# Patient Record
Sex: Female | Born: 1990 | Race: Black or African American | Hispanic: No | Marital: Single
Health system: Midwestern US, Community
[De-identification: ages and names within clinical notes are randomized; demographics above are authoritative.]

## PROBLEM LIST (undated history)

## (undated) DIAGNOSIS — Z789 Other specified health status: Secondary | ICD-10-CM

## (undated) DIAGNOSIS — Z808 Family history of malignant neoplasm of other organs or systems: Secondary | ICD-10-CM

## (undated) DIAGNOSIS — D649 Anemia, unspecified: Secondary | ICD-10-CM

---

## 2015-05-01 ENCOUNTER — Other Ambulatory Visit: Payer: Self-pay | Admitting: Obstetrics and Gynecology

## 2015-05-01 DIAGNOSIS — E049 Nontoxic goiter, unspecified: Secondary | ICD-10-CM

## 2015-05-03 ENCOUNTER — Ambulatory Visit
Admission: RE | Admit: 2015-05-03 | Discharge: 2015-05-03 | Disposition: A | Discharge status: Home / Self Care | Payer: BLUE CROSS/BLUE SHIELD | Source: Ambulatory Visit | Attending: Obstetrics and Gynecology | Admitting: Obstetrics and Gynecology

## 2015-05-03 DIAGNOSIS — E049 Nontoxic goiter, unspecified: Secondary | ICD-10-CM

## 2015-06-26 ENCOUNTER — Ambulatory Visit (INDEPENDENT_AMBULATORY_CARE_PROVIDER_SITE_OTHER): Payer: BLUE CROSS/BLUE SHIELD | Admitting: Internal Medicine

## 2015-06-26 ENCOUNTER — Encounter: Payer: Self-pay | Admitting: Internal Medicine

## 2015-06-26 VITALS — BP 102/78 | HR 111 | Temp 99.1°F | Ht 64.0 in | Wt 227.0 lb

## 2015-06-26 DIAGNOSIS — E042 Nontoxic multinodular goiter: Secondary | ICD-10-CM | POA: Diagnosis not present

## 2015-06-26 NOTE — Progress Notes (Addendum)
Patient ID: Linda Proctor, female   DOB: Mar 12, 1991, 24 y.o.   MRN: [id number]   HPI  Linda Proctor is a 24 y.o.-year-old female, referred by her  ObGyn Dr., Dr. 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 She is here with her mother who offers part of the hx.  No h/o neck compression sxs >> saw ObGyn 04/2015 >> palpated a goiter.  Thyroid U/S (05/03/2015):  Thyromegaly with multiple nodules, including 3 nodules >2 cm in the left thyroid lobe:   Pt denies feeling nodules in neck, hoarseness, dysphagia/odynophagia, SOB with lying down.  I reviewed pt's thyroid tests: 05/01/2015: TSH 2.118   Pt denies: - fatigue - heat intolerance/cold intolerance - tremors - palpitations - anxiety/depression - hyperdefecation/constipation - weight loss - weight gain - dry skin - hair loss  Pt does have a FH of thyroid ds. + FH of thyroid cancer - in mother (PTC). No h/o radiation tx to head or neck.  No seaweed or kelp, no recent contrast studies. No steroid use. No herbal supplements.   I reviewed her chart and she also has a history of vit D def and anemia.   ROS: Constitutional: no weight gain/loss, no fatigue, no subjective hyperthermia/hypothermia, + poor sleep Eyes: no blurry vision, no xerophthalmia ENT: no sore throat, no nodules palpated in throat, no dysphagia/odynophagia, no hoarseness Cardiovascular: no CP/SOB/palpitations/leg swelling Respiratory: no cough/SOB Gastrointestinal: no N/V/D/C Musculoskeletal: + muscle pain/no joint aches Skin: no rashes Neurological: no tremors/numbness/tingling/dizziness Psychiatric: no depression/anxiety  PMH: - see HPI  No past surgical history.  Social History   Social History  . Marital Status: Single    Spouse Name: N/A  . Number of Children: 0   Occupational History  . [employer]   Social History Main Topics  . Smoking status: Never Smoker   . Smokeless tobacco: Not on file  . Alcohol Use: No  .  Drug Use: No   No prescription meds/  No Known Allergies  FH: - DM and HTN in GM - PTC in mother  PE: BP 102/78 mmHg  Pulse 111  Temp(Src) 99.1 F (37.3 C)  Ht 5' 4" (1.626 m)  Wt 227 lb (102.967 kg)  BMI 38.95 kg/m2 Wt Readings from Last 3 Encounters:  06/26/15 227 lb (102.967 kg)   Constitutional: overweight, in NAD Eyes: PERRLA, EOMI, no exophthalmos ENT: moist mucous membranes, + thyromegaly - L>R, no cervical lymphadenopathy Cardiovascular: RRR (at the time of the exam), No MRG Respiratory: CTA B Gastrointestinal: abdomen soft, NT, ND, BS+ Musculoskeletal: no deformities, strength intact in all 4;  Skin: moist, warm, no rashes Neurological: no tremor with outstretched hands, DTR normal in all 4  ASSESSMENT: 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 Heterogeneous echotexture. Multiple Proctor nodules. Largest 12 x 9 x 12 mm hypoechoic solid, inferior pole. There is an adjacent 11 x 5 x 7 mm lower pole hypoechoic nodule. Remainder less than 1 cm.  Left thyroid lobe: 59 x 23 x 29 mm. Multiple nodules. Complex cystic nodule 23 x 9 x 20 mm, upper pole. There is adjacent deep 11 x 5 x 8 mm hypoechoic nodule. There is a complex mostly cystic 24 x 11 x 15 mm nodule, mid lobe. Adjacent 21 x 14 x 12 mm hypoechoic nodule.  Isthmus Thickness: 6 mm. No nodules visualized.  Lymphadenopathy: None visualized.  IMPRESSION: 1. Thyromegaly with bilateral nodules. The dominant left lesions meet consensus criteria for  biopsy.   PLAN: 1. MNG  - I reviewed the images of her thyroid ultrasound along with the patient and her mother. I pointed out that the dominant nodules are large, this being a risk factor for cancer. They are hypoechoic, without calcifications (they have colloid granules), without internal blood flow, more wide than tall, and well delimited from surrounding tissue. Pt does have a thyroid cancer family history but no  personal history of RxTx to head/neck.  - the only way that we can tell exactly if her nodules are cancerous or not is by doing a thyroid biopsy (FNA). I explained what the test entails.  - another option is to wait for another 6 months to a year and only intervene at that time >> would not recommend this  - I explained that this is not cancer, we can continue to follow her on a yearly basis, and check another ultrasound in another year or 2. - we discussed about ThyCa in general: prognosis, evolution, treatment and f/u. - patient decided to have the FNA done now >> I ordered this.  - I'll see her back in a year, assuming her FNA is normal. If FNA abnormal, we will meet sooner.  - I advised pt to join my chart and I will send her the results through there   FNA pending - appt on 07/19/2015. I will addend the results when they become available.  Adequacy Reason Satisfactory For Evaluation. Diagnosis THYROID, FINE NEEDLE ASPIRATION, LMP ( SPECIMEN 1 OF 3, COLLECTED ON 07/19/15): BENIGN. (BETHESDA CATEGORY II). FINDINGS CONSISTENT WITH LYMPHOCYTIC (HASHIMOTO) THYROIDITIS. Linda Laws MD Pathologist, Electronic Signature (Case signed 07/21/2015) Specimen Clinical Information Family h/o thyroid ca-pt's mother, There is a complex mostly cystic 24 x 11 x 15 mm nodule, left mid lobe Source  Thyroid, Fine Needle Aspiration, Lt Lobe LMP, (Specimen 1 of 3, collected on 07/19/15 )  Adequacy Reason Satisfactory For Evaluation. Diagnosis THYROID, FINE NEEDLE ASPIRATION, LEFT LOBE, LUP (SPECIMEN 2 OF 3 BENIGN. (BETHESDA CATEGORY II). FINDINGS CONSISTENT WITH LYMPHOCYTIC (HASHIMOTO) THYROIDITIS. Linda Laws MD Pathologist, Electronic Signature (Case signed 07/21/2015) Specimen Clinical Information Family h/o thyroid CA-PT'S mother, Complex cystic nodule 23 x 9 x 20 mm, left upper pole Source Thyroid, Fine Needle Aspiration, Lt Lobe LUP, (Specimen 2 of 3, collected on 07/19/15 )  Adequacy  Reason Satisfactory For Evaluation. Diagnosis THYROID, FINE NEEDLE ASPIRATION, LEFT LOBE LMP (SPECIMEN 3 OF 3 COLLECTED 07-19-2015) BENIGN. (BETHESDA CATEGORY II). FINDINGS CONSISTENT WITH LYMPHOCYTIC (HASHIMOTO) THYROIDITIS. Linda Laws MD Pathologist, Electronic Signature (Case signed 07/21/2015) Specimen Clinical Information Family h/o thyroid CA-PT'S mother, Adjacent 21 x 14 x 12 mm hypoechoic nodule left mid pole Source Thyroid, Fine Needle Aspiration, Lt Lobe LMP, (Specimen 3 of 3, collected on 07/19/15 )  All nodules are benign, and suggest inflammatory changes due to Hashimoto's thyroiditis. I will repeat her thyroid tests when the patient returns. Nothing to do for now.

## 2015-06-26 NOTE — Patient Instructions (Signed)
Please schedule the thyroid biopsies with Annasha.  Please return in 1 year or sooner if needed.  Thyroid Biopsy The thyroid gland is a butterfly-shaped gland situated in the front of the neck. It produces hormones which affect metabolism, growth and development, and body temperature. A thyroid biopsy is a procedure in which small samples of tissue or fluid are removed from the thyroid gland or mass and examined under a microscope. This test is done to determine the cause of thyroid problems, such as infection, cancer, or other thyroid problems. There are 2 ways to obtain samples: 1. Fine needle biopsy. Samples are removed using a thin needle inserted through the skin and into the thyroid gland or mass. 2. Open biopsy. Samples are removed after a cut (incision) is made through the skin. LET YOUR CAREGIVER KNOW ABOUT:   Allergies.  Medications taken including herbs, eye drops, over-the-counter medications, and creams.  Use of steroids (by mouth or creams).  Previous problems with anesthetics or numbing medicine.  Possibility of pregnancy, if this applies.  History of blood clots (thrombophlebitis).  History of bleeding or blood problems.  Previous surgery.  Other health problems. RISKS AND COMPLICATIONS  Bleeding from the site. The risk of bleeding is higher if you have a bleeding disorder or are taking any blood thinning medications (anticoagulants).  Infection.  Injury to structures near the thyroid gland. BEFORE THE PROCEDURE  This is a procedure that can be done as an outpatient. Confirm the time that you need to arrive for your procedure. Confirm whether there is a need to fast or withhold any medications. A blood sample may be done to determine your blood clotting time. Medicine may be given to help you relax (sedative). PROCEDURE Fine needle biopsy. You will be awake during the procedure. You may be asked to lie on your back with your head tipped backward to extend your  neck. Let your caregiver know if you cannot tolerate the positioning. An area on your neck will be cleansed. A needle is inserted through the skin of your neck. You may feel a mild discomfort during this procedure. You may be asked to avoid coughing, talking, swallowing, or making sounds during some portions of the procedure. The needle is withdrawn once tissue or fluid samples have been removed. Pressure may be applied to the neck to reduce swelling and ensure that bleeding has stopped. The samples will be sent for examination.  Open biopsy. You will be given general anesthesia. You will be asleep during the procedure. An incision is made in your neck. A sample of thyroid tissue or the mass is removed. The tissue sample or mass will be sent for examination. The sample or mass may be examined during the biopsy. If the sample or mass contains cancer cells, some or all of the thyroid gland may be removed. The incision is closed with stitches. AFTER THE PROCEDURE  Your recovery will be assessed and monitored. If there are no problems, as an outpatient, you should be able to go home shortly after the procedure. If you had a fine needle biopsy:  You may have soreness at the biopsy site for 1 to 2 days. If you had an open biopsy:   You may have soreness at the biopsy site for 3 to 4 days.  You may have a hoarse voice or sore throat for 1 to 2 days. Obtaining the Test Results It is your responsibility to obtain your test results. Do not assume everything is normal if you  have not heard from your caregiver or the medical facility. It is important for you to follow up on all of your test results. HOME CARE INSTRUCTIONS   Keeping your head raised on a pillow when you are lying down may ease biopsy site discomfort.  Supporting the back of your head and neck with both hands as you sit up from a lying position may ease biopsy site discomfort.  Only take over-the-counter or prescription medicines for pain,  discomfort, or fever as directed by your caregiver.  Throat lozenges or gargling with warm salt water may help to soothe a sore throat. SEEK IMMEDIATE MEDICAL CARE IF:   You have severe bleeding from the biopsy site.  You have difficulty swallowing.  You have a fever.  You have increased pain, swelling, redness, or warmth at the biopsy site.  You notice pus coming from the biopsy site.  You have swollen glands (lymph nodes) in your neck. Document Released: 08/18/2007 Document Revised: 02/15/2013 Document Reviewed: 01/13/2014 [facility] Patient Information 2015 [place], [place]. This information is not intended to replace advice given to you by your health care provider. Make sure you discuss any questions you have with your health care provider.

## 2015-07-07 ENCOUNTER — Encounter: Payer: Self-pay | Admitting: Internal Medicine

## 2015-07-19 ENCOUNTER — Other Ambulatory Visit (HOSPITAL_COMMUNITY)
Admission: RE | Admit: 2015-07-19 | Discharge: 2015-07-19 | Disposition: A | Discharge status: Home / Self Care | Payer: BLUE CROSS/BLUE SHIELD | Source: Ambulatory Visit | Attending: Radiology | Admitting: Radiology

## 2015-07-19 ENCOUNTER — Other Ambulatory Visit (HOSPITAL_COMMUNITY)
Admission: RE | Admit: 2015-07-19 | Discharge: 2015-07-19 | Disposition: A | Discharge status: Home / Self Care | Payer: BLUE CROSS/BLUE SHIELD | Source: Ambulatory Visit | Attending: Internal Medicine | Admitting: Internal Medicine

## 2015-07-19 ENCOUNTER — Ambulatory Visit
Admission: RE | Admit: 2015-07-19 | Discharge: 2015-07-19 | Disposition: A | Discharge status: Home / Self Care | Payer: BLUE CROSS/BLUE SHIELD | Source: Ambulatory Visit | Attending: Internal Medicine | Admitting: Internal Medicine

## 2015-07-19 DIAGNOSIS — E041 Nontoxic single thyroid nodule: Secondary | ICD-10-CM | POA: Insufficient documentation

## 2015-12-06 ENCOUNTER — Emergency Department (HOSPITAL_COMMUNITY)
Admission: EM | Admit: 2015-12-06 | Discharge: 2015-12-06 | Disposition: A | Discharge status: Home / Self Care | Payer: Self-pay | Attending: Emergency Medicine | Admitting: Emergency Medicine

## 2015-12-06 ENCOUNTER — Encounter (HOSPITAL_COMMUNITY): Payer: Self-pay | Admitting: Emergency Medicine

## 2015-12-06 DIAGNOSIS — Z3202 Encounter for pregnancy test, result negative: Secondary | ICD-10-CM | POA: Insufficient documentation

## 2015-12-06 DIAGNOSIS — R55 Syncope and collapse: Secondary | ICD-10-CM | POA: Insufficient documentation

## 2015-12-06 DIAGNOSIS — E869 Volume depletion, unspecified: Secondary | ICD-10-CM | POA: Insufficient documentation

## 2015-12-06 DIAGNOSIS — R42 Dizziness and giddiness: Secondary | ICD-10-CM | POA: Insufficient documentation

## 2015-12-06 DIAGNOSIS — Z862 Personal history of diseases of the blood and blood-forming organs and certain disorders involving the immune mechanism: Secondary | ICD-10-CM | POA: Insufficient documentation

## 2015-12-06 LAB — I-STAT CHEM 8, ED
BUN: 14 mg/dL (ref 6–20)
CALCIUM ION: 1.05 mmol/L — AB (ref 1.12–1.23)
CHLORIDE: 104 mmol/L (ref 101–111)
Creatinine, Ser: 0.6 mg/dL (ref 0.44–1.00)
Glucose, Bld: 90 mg/dL (ref 65–99)
HCT: 48 % — ABNORMAL HIGH (ref 36.0–46.0)
Hemoglobin: 16.3 g/dL — ABNORMAL HIGH (ref 12.0–15.0)
POTASSIUM: 3.3 mmol/L — AB (ref 3.5–5.1)
SODIUM: 143 mmol/L (ref 135–145)
TCO2: 23 mmol/L (ref 0–100)

## 2015-12-06 LAB — I-STAT BETA HCG BLOOD, ED (MC, WL, AP ONLY)

## 2015-12-06 MED ORDER — SODIUM CHLORIDE 0.9 % IV BOLUS (SEPSIS)
1000.0000 mL | Freq: Once | INTRAVENOUS | Status: AC
  Administered 2015-12-06: 1000 mL via INTRAVENOUS

## 2015-12-06 NOTE — Discharge Instructions (Signed)

## 2015-12-06 NOTE — ED Notes (Signed)
Pt. arrived with EMS from school reports near syncopal episode this evening with dizziness and lightheaded , she donated plasma today , received 400 ml NS IV by EMS prior to arrival . Alert and oriented at arrival .

## 2015-12-06 NOTE — ED Provider Notes (Signed)
CSN: [id number]     Arrival date & time 12/06/15  2024 History   First MD Initiated Contact with Patient 12/06/15 2025     Chief Complaint  Patient presents with  . Near Syncope     (Consider location/radiation/quality/duration/timing/severity/associated sxs/prior Treatment) HPI  Peritumoral female history of anemia who presents today after near-syncopal episode. She gave plasma this afternoon from 2-4. She states that her hematocrit was noted to be at 39 she was there. She went to her normal class tonight began feeling somewhat lightheaded. She left the classroom to get a drink of water and became very lightheaded and slumped to the ground. She denies any injury or total loss of consciousness. She states that there were EMS didn't sue assisted her. EMS was called and on their arrival they state that her systolic blood pressure was 80. She received 400 mL of fluid and route and feels greatly improved with a systolic blood pressure greater than 100. She denies any previous episodes. She denies any recent bleeding. She denies any head pain, neck pain, chest pain, dyspnea.  History reviewed. No pertinent past medical history. History reviewed. No pertinent past surgical history. No family history on file. Social History  Substance Use Topics  . Smoking status: Never Smoker   . Smokeless tobacco: None  . Alcohol Use: No   OB History    No data available     Review of Systems  All other systems reviewed and are negative.     Allergies  Review of patient's allergies indicates no known allergies.  Home Medications   Prior to Admission medications   Not on File   LMP 11/18/2015 (Approximate) Physical Exam  Constitutional: She is oriented to person, place, and time. She appears well-developed and well-nourished.  HENT:  Head: Normocephalic and atraumatic.  Right Ear: External ear normal.  Left Ear: External ear normal.  Nose: Nose normal.  Mouth/Throat: Oropharynx is clear and  moist.  Eyes: Conjunctivae and EOM are normal. Pupils are equal, round, and reactive to light.  Neck: Normal range of motion. Neck supple.  Cardiovascular: Normal rate, regular rhythm, normal heart sounds and intact distal pulses.   Pulmonary/Chest: Effort normal and breath sounds normal.  Abdominal: Soft. Bowel sounds are normal.  Musculoskeletal: Normal range of motion.  Neurological: She is alert and oriented to person, place, and time. She has normal reflexes.  Skin: Skin is warm and dry.  Psychiatric: She has a normal mood and affect. Her behavior is normal. Judgment and thought content normal.  Nursing note and vitals reviewed.   ED Course  Procedures (including critical care time) Labs Review Labs Reviewed  I-STAT CHEM 8, ED - Abnormal; Notable for the following:    Potassium 3.3 (*)    Calcium, Ion 1.05 (*)    Hemoglobin 16.3 (*)    HCT 48.0 (*)    All other components within normal limits  I-STAT BETA HCG BLOOD, ED ([facility], [facility], [facility] ONLY)    Imaging Review No results found. I have personally reviewed and evaluated these images and lab results as part of my medical decision-making.   EKG Interpretation   Date/Time:  Wednesday December 06 2015 21:17:19 EST Ventricular Rate:  72 PR Interval:  150 QRS Duration: 84 QT Interval:  372 QTC Calculation: 407 R Axis:   29 Text Interpretation:  Normal sinus rhythm Confirmed by Ladina Shutters MD, Duwayne Heck  [phone]) on 12/06/2015 9:56:47 PM      MDM   Final diagnoses:  Near syncope  Volume depletion    25 year old female presents after near-syncopal episode after giving plasma today. EMS reports systolic blood pressure of 80 on their arrival. Blood pressure 100/70 now. Patient received 1 L normal saline. She feels really improved. She is discharged to home and advised against further plasma donation.    Margarita Grizzle, MD 12/06/15 2159

## 2016-03-20 IMAGING — US US THYROID BIOPSY
1 series · 13 of 25 positions shown · non-contrast
Comparison: US soft tissue Head/Neck 05/03/2015

MEDICATIONS:
None

COMPLICATIONS:
None immediate

INDICATION: Indeterminate thyroid nodules

EXAM:
ULTRASOUND GUIDED THYROID FINE NEEDLE ASPIRATION x3
TECHNIQUE: Informed written consent was obtained from the patient after a
discussion of the risks, benefits and alternatives to treatment.
Questions regarding the procedure were encouraged and answered. A
timeout was performed prior to the initiation of the procedure.

[Series 1: us thyroid biopsy · 0.07mm/px · 48 acquisitions, 13 frames shown]
[im 1/48]
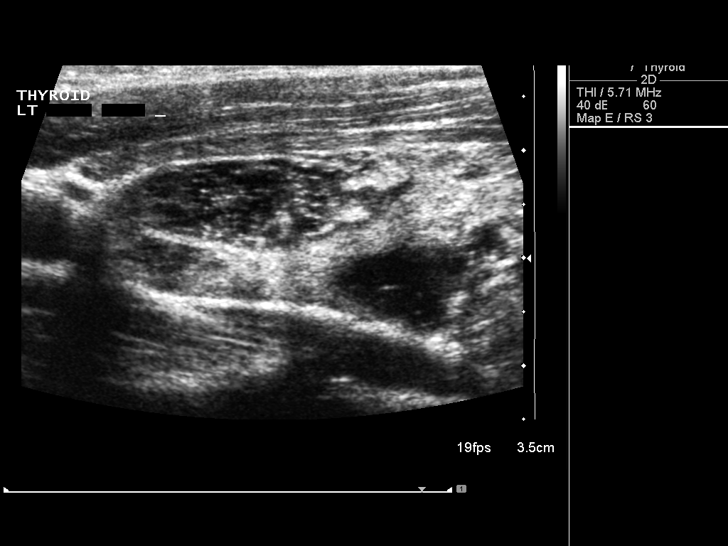
[im 4/48]
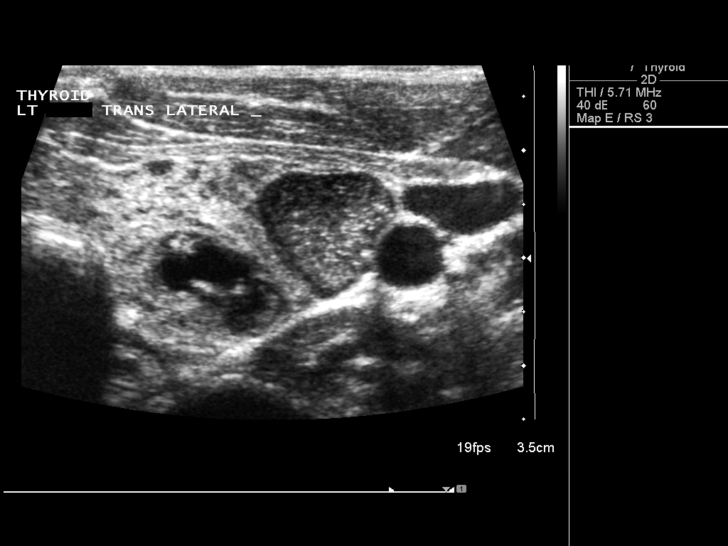
[im 8/48]
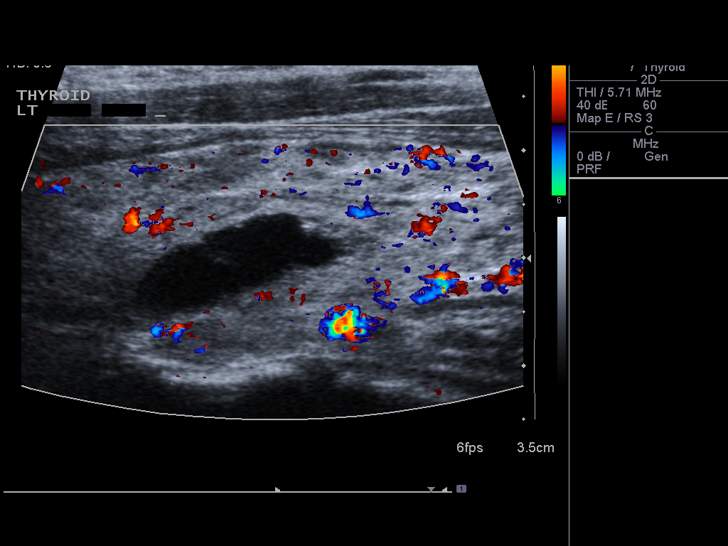
[im 12/48]
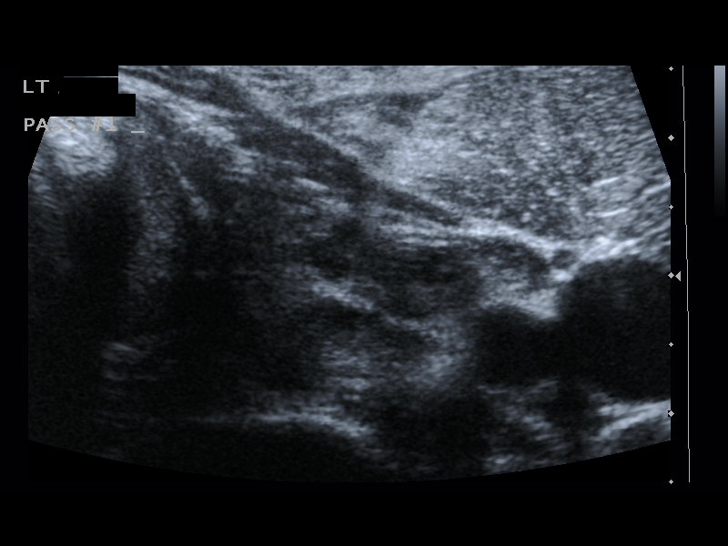
[im 16/48]
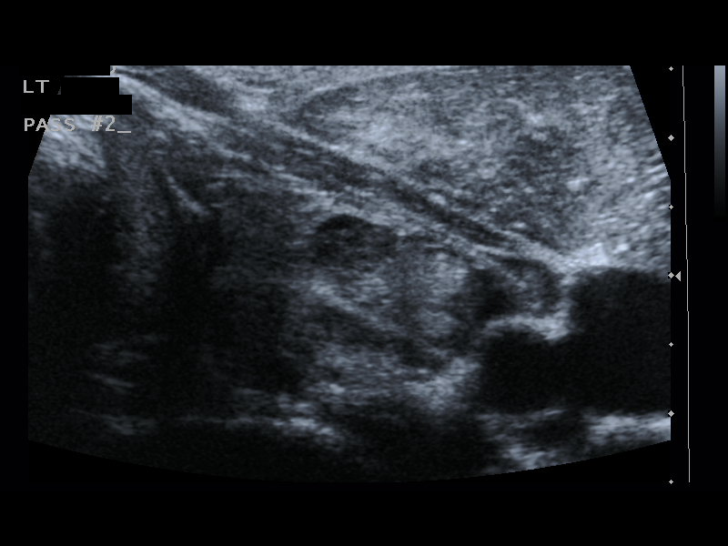
[im 20/48]
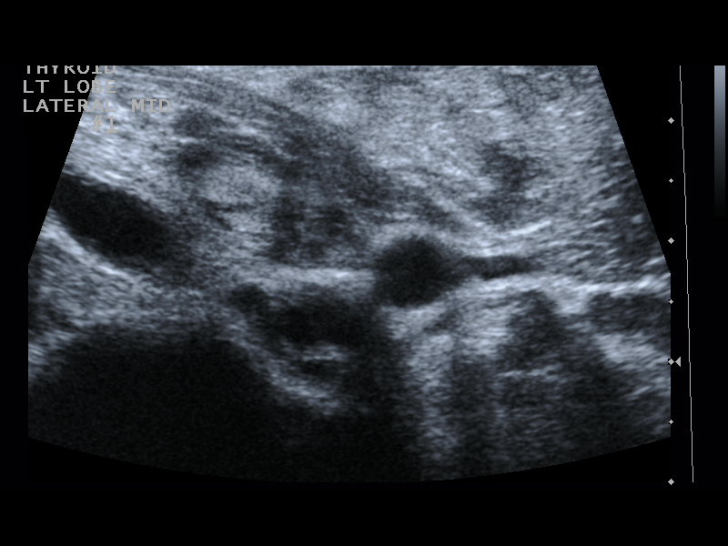
[im 24/48]
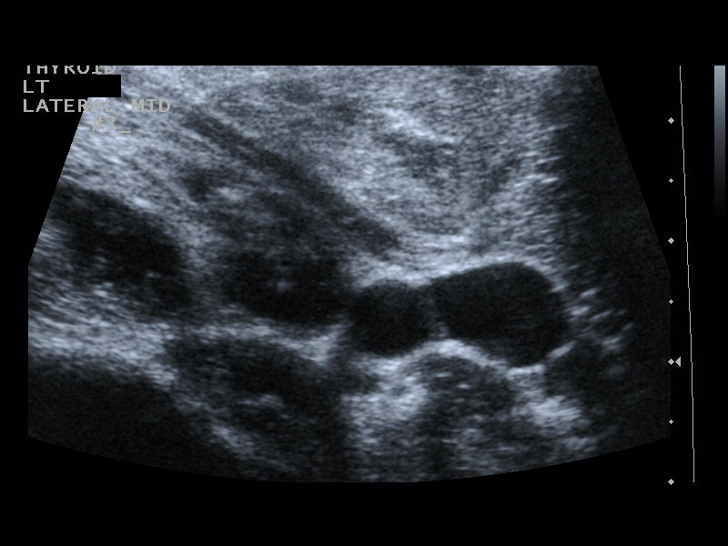
[im 28/48]
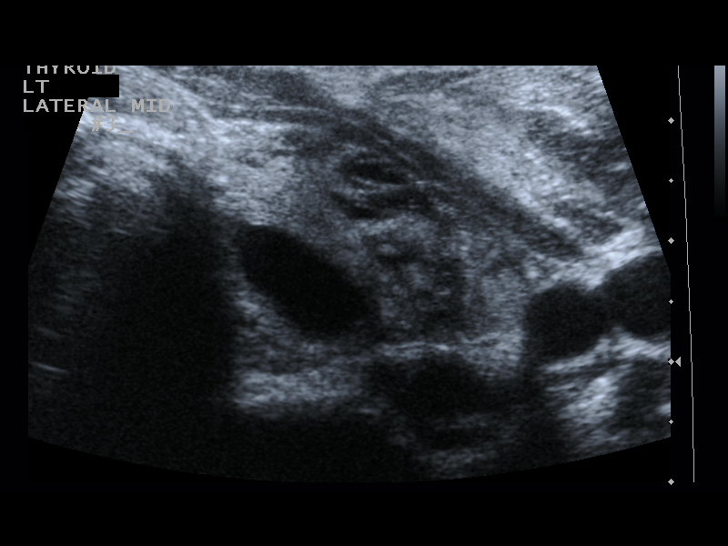
[im 32/48]
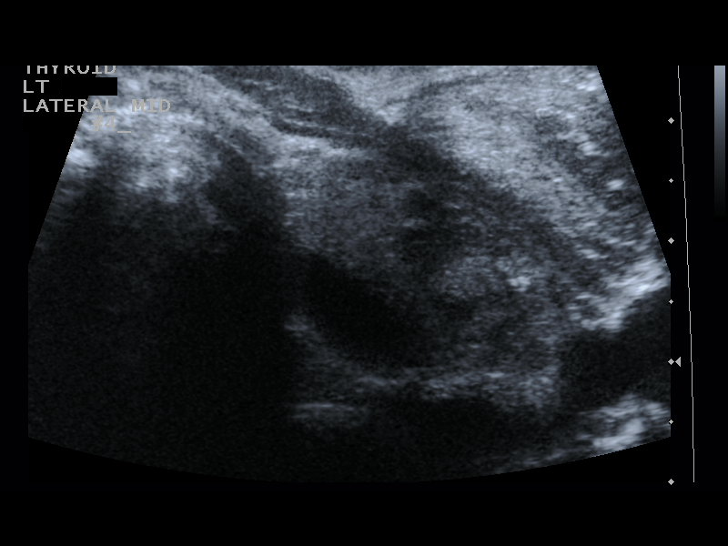
[im 36/48]
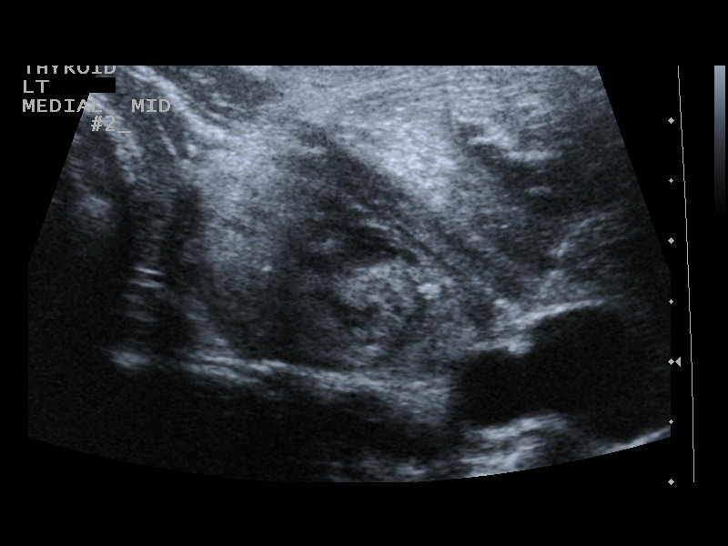
[im 40/48]
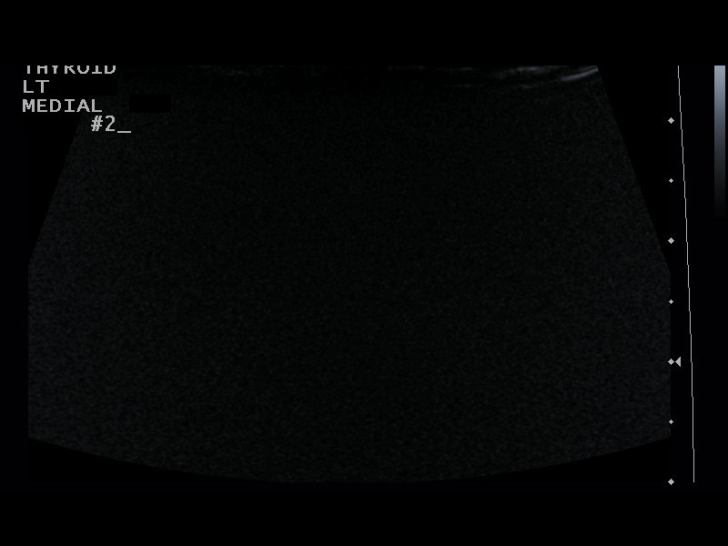
[im 44/48]
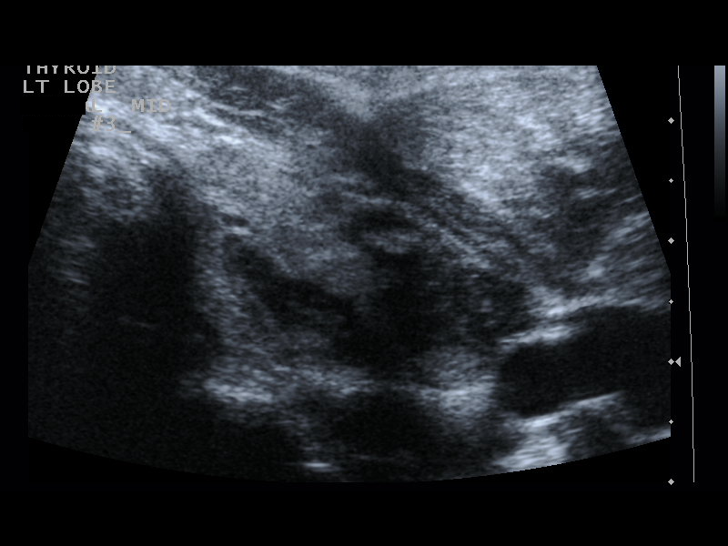
[im 48/48]
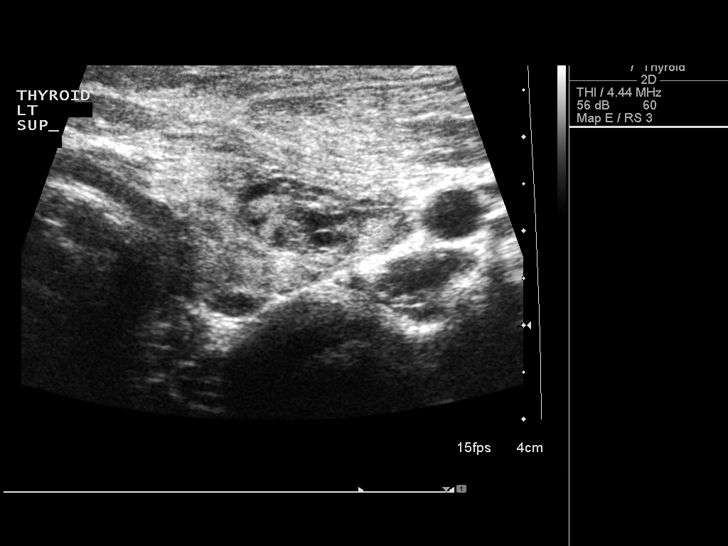

[13 of 25 positions shown; findings below may reference images not displayed]

Pre-procedural ultrasound scanning demonstrated Complex cystic
nodule 23 x 9 x 20 mm, upper pole. There is a complex mostly cystic
24 x 11 x 15 mm nodule, mid lobe. Adjacent 21 x 14 x 12 mm
hypoechoic nodule.

The procedures were planned. The neck was prepped in the usual
sterile fashion, and a sterile drape was applied covering the
operative field. A timeout was performed prior to the initiation of
the procedure. Local anesthesia was provided with 1% lidocaine.

Under direct ultrasound guidance, 3 FNA biopsies were performed of
the left upper lobe thyroid nodule with a 27 gauge needle. The
samples were prepared and submitted to pathology.

Under direct ultrasound guidance, 3 FNA biopsies were performed of
the left lateral mid lobe thyroid nodule with a 27 gauge needle. The
samples were prepared and submitted to pathology.

Under direct ultrasound guidance, 3 FNA biopsies were performed of
the left medial mid lobe thyroid nodule with a 27 gauge needle. The
samples were prepared and submitted to pathology.

Limited post procedural scanning was negative for hematoma or
additional complication. Dressings were placed. The patient
tolerated the above procedures procedure well without immediate
postprocedural complication.
IMPRESSION: Technically successful ultrasound guided fine needle aspiration of
left upper lobe thyroid nodule.

Technically successful ultrasound guided fine needle aspiration of
left lateral mid lobe thyroid nodule.

Technically successful ultrasound guided fine needle aspiration of
left medial mid lobe thyroid nodule.

## 2016-06-25 ENCOUNTER — Ambulatory Visit: Payer: BLUE CROSS/BLUE SHIELD | Admitting: Internal Medicine

## 2016-06-25 DIAGNOSIS — Z0289 Encounter for other administrative examinations: Secondary | ICD-10-CM

## 2016-12-10 ENCOUNTER — Other Ambulatory Visit: Payer: Self-pay | Admitting: Occupational Medicine

## 2016-12-10 ENCOUNTER — Ambulatory Visit: Payer: Self-pay

## 2016-12-10 DIAGNOSIS — M25562 Pain in left knee: Secondary | ICD-10-CM

## 2017-04-08 ENCOUNTER — Emergency Department (HOSPITAL_COMMUNITY): Payer: BLUE CROSS/BLUE SHIELD

## 2017-04-08 ENCOUNTER — Encounter (HOSPITAL_COMMUNITY): Payer: Self-pay

## 2017-04-08 DIAGNOSIS — Y929 Unspecified place or not applicable: Secondary | ICD-10-CM | POA: Insufficient documentation

## 2017-04-08 DIAGNOSIS — S93601A Unspecified sprain of right foot, initial encounter: Secondary | ICD-10-CM | POA: Insufficient documentation

## 2017-04-08 DIAGNOSIS — W108XXA Fall (on) (from) other stairs and steps, initial encounter: Secondary | ICD-10-CM | POA: Diagnosis not present

## 2017-04-08 DIAGNOSIS — Y939 Activity, unspecified: Secondary | ICD-10-CM | POA: Insufficient documentation

## 2017-04-08 DIAGNOSIS — Y999 Unspecified external cause status: Secondary | ICD-10-CM | POA: Diagnosis not present

## 2017-04-08 DIAGNOSIS — S99921A Unspecified injury of right foot, initial encounter: Secondary | ICD-10-CM | POA: Diagnosis present

## 2017-04-08 NOTE — ED Triage Notes (Signed)
Pt injured right foot after falling on stairs 1 week ago. Has been applying hot/cold compresses with no relief, pt is ambulatory

## 2017-04-09 ENCOUNTER — Emergency Department (HOSPITAL_COMMUNITY)
Admission: EM | Admit: 2017-04-09 | Discharge: 2017-04-09 | Disposition: A | Discharge status: Home / Self Care | Payer: BLUE CROSS/BLUE SHIELD | Attending: Emergency Medicine | Admitting: Emergency Medicine

## 2017-04-09 DIAGNOSIS — S93601A Unspecified sprain of right foot, initial encounter: Secondary | ICD-10-CM

## 2017-04-09 HISTORY — DX: Anemia, unspecified: D64.9

## 2017-04-09 MED ORDER — NAPROXEN 500 MG PO TABS: 500.0000 mg | ORAL_TABLET | Freq: Two times a day (BID) | ORAL | 0 refills | Status: AC

## 2017-04-09 NOTE — ED Provider Notes (Signed)
[facility]ENCY DEPT Provider Note   CSN: [id number] Arrival date & time: 04/08/17  2305     History   Chief Complaint Chief Complaint  Patient presents with  . Foot Injury    HPI Linda Proctor is a 26 y.o. female.  Patient with complaint of persistent right foot swelling and pain since last week after a fall. No other injury. She states the swelling and pain affect the lateral foot only. No ankle pain    The history is provided by the patient. No language interpreter was used.    Past Medical History:  Diagnosis Date  . Anemia     Patient Active Problem List   Diagnosis Date Noted  . Nontoxic multinodular goiter 06/26/2015    History reviewed. No pertinent surgical history.  OB History    No data available       Home Medications    Prior to Admission medications   Not on File    Family History History reviewed. No pertinent family history.  Social History Social History  Substance Use Topics  . Smoking status: Never Smoker  . Smokeless tobacco: Not on file  . Alcohol use No     Allergies   Patient has no known allergies.   Review of Systems Review of Systems  Musculoskeletal:       See HPI.  Skin: Negative for wound.  Neurological: Negative.  Negative for weakness and numbness.     Physical Exam Updated Vital Signs BP (!) 118/57 (BP Location: Right Arm)   Pulse 72   Temp 98.2 F (36.8 C) (Oral)   Resp 20   Wt 108.9 kg (240 lb)   LMP 03/21/2017   SpO2 100%   BMI 41.20 kg/m   Physical Exam  Constitutional: She is oriented to person, place, and time. She appears well-developed and well-nourished.  Neck: Normal range of motion.  Pulmonary/Chest: Effort normal.  Musculoskeletal:  Right foot has mild swelling over lateral base of 5th MT. No deformity. FROM all toes and ankle. No discoloration.  Neurological: She is alert and oriented to person, place, and time.  Skin: Skin is warm and dry.     ED Treatments / Results    Labs (all labs ordered are listed, but only abnormal results are displayed) Labs Reviewed - No data to display  EKG  EKG Interpretation None       Radiology Dg Foot Complete Right  Result Date: 04/08/2017 CLINICAL DATA:  Foot injury pain at the base of fifth metatarsal EXAM: RIGHT FOOT COMPLETE - 3+ VIEW COMPARISON:  None. FINDINGS: There is no evidence of fracture or dislocation. There is no evidence of arthropathy or other focal bone abnormality. Soft tissues are unremarkable. IMPRESSION: Negative. Electronically Signed   By: Jasmine PangKim  Fujinaga M.D.   On: 04/08/2017 23:59    Procedures Procedures (including critical care time)  Medications Ordered in ED Medications - No data to display   Initial Impression / Assessment and Plan / ED Course  I have reviewed the triage vital signs and the nursing notes.  Pertinent labs & imaging results that were available during my care of the patient were reviewed by me and considered in my medical decision making (see chart for details).     Uncomplicated soft tissue injury to right foot. Will treat with Naproxen, Post op shoe and continued ice and elevation. PRN ortho follow up .  Final Clinical Impressions(s) / ED Diagnoses   Final diagnoses:  None   1. Right  foot injury.  New Prescriptions New Prescriptions   No medications on file     Elpidio Anis, Cordelia Poche 04/09/17 1610    Gilda Crease, MD 04/09/17 [phone]

## 2017-07-24 ENCOUNTER — Ambulatory Visit (INDEPENDENT_AMBULATORY_CARE_PROVIDER_SITE_OTHER): Payer: 59 | Admitting: Physician Assistant

## 2017-07-24 ENCOUNTER — Encounter (INDEPENDENT_AMBULATORY_CARE_PROVIDER_SITE_OTHER): Payer: Self-pay | Admitting: Physician Assistant

## 2017-07-24 VITALS — BP 105/76 | HR 61 | Temp 97.7°F | Ht 64.17 in | Wt 241.8 lb

## 2017-07-24 DIAGNOSIS — R239 Unspecified skin changes: Secondary | ICD-10-CM

## 2017-07-24 NOTE — Progress Notes (Signed)
   Subjective:  Patient ID: Linda Proctor, female    DOB: [birth year]  Age: 26 y.o. MRN: [id number]  CC: new pt  HPI Linda Proctor is a 26 y.o. female with a medical history of anemia presents as a new patient with complaint of hyperpigmentation during the summer. Hyperpigmentation only happens around her upper chest and back of neck. No associated pruritus, lesions, scaling, crusting. There is no hyperpigmentation today. Does not endorse any other symptoms or complaints.     Outpatient Medications Prior to Visit  Medication Sig Dispense Refill  . naproxen (NAPROSYN) 500 MG tablet Take 1 tablet (500 mg total) by mouth 2 (two) times daily. (Patient not taking: Reported on 07/24/2017) 30 tablet 0   No facility-administered medications prior to visit.      ROS Review of Systems  Constitutional: Negative for chills, fever and malaise/fatigue.  Eyes: Negative for blurred vision.  Respiratory: Negative for shortness of breath.   Cardiovascular: Negative for chest pain and palpitations.  Gastrointestinal: Negative for abdominal pain and nausea.  Genitourinary: Negative for dysuria and hematuria.  Musculoskeletal: Negative for joint pain and myalgias.  Skin: Negative for rash.       Hyperpigmentation on upper chest and back of neck during the summer.  Neurological: Negative for tingling and headaches.  Psychiatric/Behavioral: Negative for depression. The patient is not nervous/anxious.     Objective:  BP 105/76 (BP Location: Left Arm, Patient Position: Sitting, Cuff Size: Large)   Pulse 61   Temp 97.7 F (36.5 C) (Oral)   Ht 5' 4.17" (1.63 m)   Wt 241 lb 12.8 oz (109.7 kg)   LMP 07/23/2017 (Exact Date)   SpO2 98%   BMI 41.28 kg/m   BP/Weight 07/24/2017 04/09/2017 04/08/2017  Systolic BP 105 118 -  Diastolic BP 76 57 -  Wt. (Lbs) 241.8 - 240  BMI 41.28 41.2 -      Physical Exam  Constitutional: She is oriented to person, place, and time.  Well developed,  overweight, NAD, polite  HENT:  Head: Normocephalic and atraumatic.  Eyes: Conjunctivae are normal. No scleral icterus.  Neck: Normal range of motion. Neck supple. No thyromegaly present.  Pulmonary/Chest: Effort normal.  Neurological: She is alert and oriented to person, place, and time. Coordination normal.  Skin: Skin is warm and dry. No rash noted. No erythema. No pallor.  Acanthosis nigricans on the back of neck  Psychiatric: She has a normal mood and affect. Her behavior is normal. Thought content normal.  Vitals reviewed.    Assessment & Plan:    1. Unspecified skin changes - Pt asymptomatic today. Skin changes happen only on the upper chest and back of neck during summer time. I have advised for patient to return if there are any skin changes again. In the meantime, I have advised patient to return for a full physical to include blood work.     Follow-up: Return in about 4 weeks (around 08/21/2017).   Loletta Specter PA

## 2017-07-24 NOTE — Patient Instructions (Signed)
Vitamin D Deficiency °Vitamin D deficiency is when your body does not have enough vitamin D. Vitamin D is important to your body for many reasons: °· It helps the body to absorb two important minerals, called calcium and phosphorus. °· It plays a role in bone health. °· It may help to prevent some diseases, such as diabetes and multiple sclerosis. °· It plays a role in muscle function, including heart function. ° °You can get vitamin D by: °· Eating foods that naturally contain vitamin D. °· Eating or drinking milk or other dairy products that have vitamin D added to them. °· Taking a vitamin D supplement or a multivitamin supplement that contains vitamin D. °· Being in the sun. Your body naturally makes vitamin D when your skin is exposed to sunlight. Your body changes the sunlight into a form of the vitamin that the body can use. ° °If vitamin D deficiency is severe, it can cause a condition in which your bones become soft. In adults, this condition is called osteomalacia. In children, this condition is called rickets. °What are the causes? °Vitamin D deficiency may be caused by: °· Not eating enough foods that contain vitamin D. °· Not getting enough sun exposure. °· Having certain digestive system diseases that make it difficult for your body to absorb vitamin D. These diseases include Crohn disease, chronic pancreatitis, and cystic fibrosis. °· Having a surgery in which a part of the stomach or a part of the small intestine is removed. °· Being obese. °· Having chronic kidney disease or liver disease. ° °What increases the risk? °This condition is more likely to develop in: °· Older people. °· People who do not spend much time outdoors. °· People who live in a long-term care facility. °· People who have had broken bones. °· People with weak or thin bones (osteoporosis). °· People who have a disease or condition that changes how the body absorbs vitamin D. °· People who have dark skin. °· People who take certain  medicines, such as steroid medicines or certain seizure medicines. °· People who are overweight or obese. ° °What are the signs or symptoms? °In mild cases of vitamin D deficiency, there may not be any symptoms. If the condition is severe, symptoms may include: °· Bone pain. °· Muscle pain. °· Falling often. °· Broken bones caused by a minor injury. ° °How is this diagnosed? °This condition is usually diagnosed with a blood test. °How is this treated? °Treatment for this condition may depend on what caused the condition. Treatment options include: °· Taking vitamin D supplements. °· Taking a calcium supplement. Your health care provider will suggest what dose is best for you. ° °Follow these instructions at home: °· Take medicines and supplements only as told by your health care provider. °· Eat foods that contain vitamin D. Choices include: °? Fortified dairy products, cereals, or juices. Fortified means that vitamin D has been added to the food. Check the label on the package to be sure. °? Fatty fish, such as salmon or trout. °? Eggs. °? Oysters. °· Do not use a tanning bed. °· Maintain a healthy weight. Lose weight, if needed. °· Keep all follow-up visits as told by your health care provider. This is important. °Contact a health care provider if: °· Your symptoms do not go away. °· You feel like throwing up (nausea) or you throw up (vomit). °· You have fewer bowel movements than usual or it is difficult for you to have a   bowel movement (constipation). °This information is not intended to replace advice given to you by your health care provider. Make sure you discuss any questions you have with your health care provider. °Document Released: 01/13/2012 Document Revised: 04/03/2016 Document Reviewed: 03/08/2015 °Elsevier Interactive Patient Education © 2018 [employer] ° °

## 2017-07-24 NOTE — Progress Notes (Signed)
Pt states that her skin changes colors during season changes, wants to know if this has anything to do with her Vit D deficiency

## 2017-08-21 ENCOUNTER — Ambulatory Visit (INDEPENDENT_AMBULATORY_CARE_PROVIDER_SITE_OTHER): Payer: Self-pay | Admitting: Physician Assistant

## 2017-12-09 IMAGING — DX DG FOOT COMPLETE 3+V*R*
3 series · 3 of 3 positions shown · non-contrast
Comparison: None.

CLINICAL DATA: Foot injury pain at the base of fifth metatarsal

EXAM:
RIGHT FOOT COMPLETE - 3+ VIEW

[foot ap]
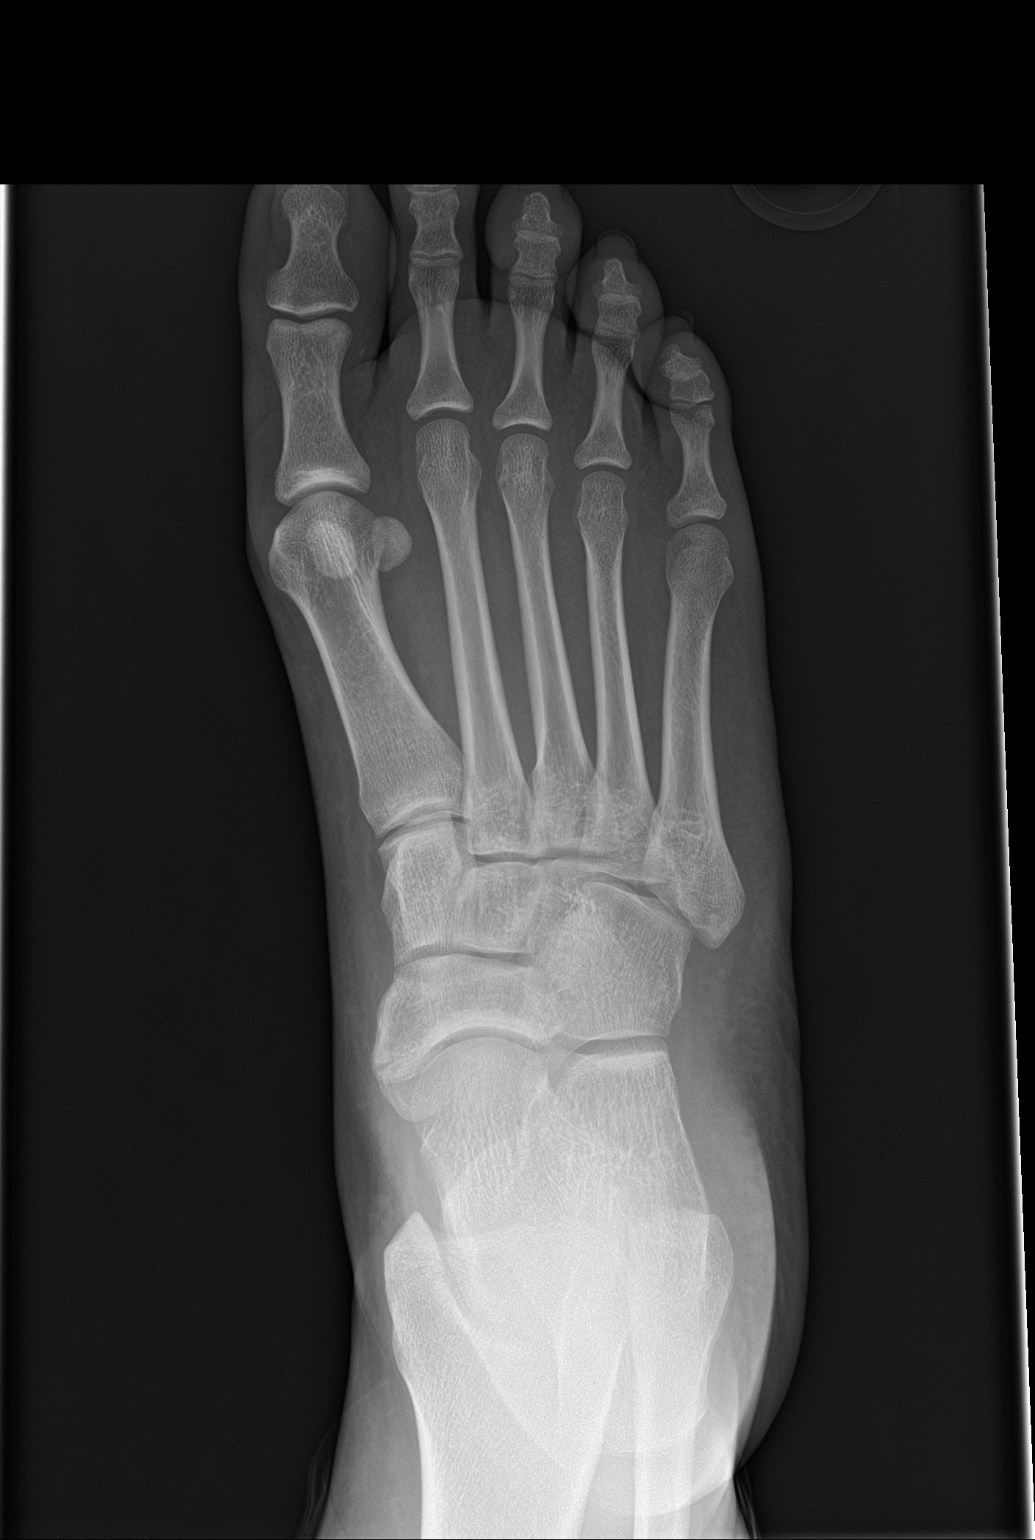

[foot obl]
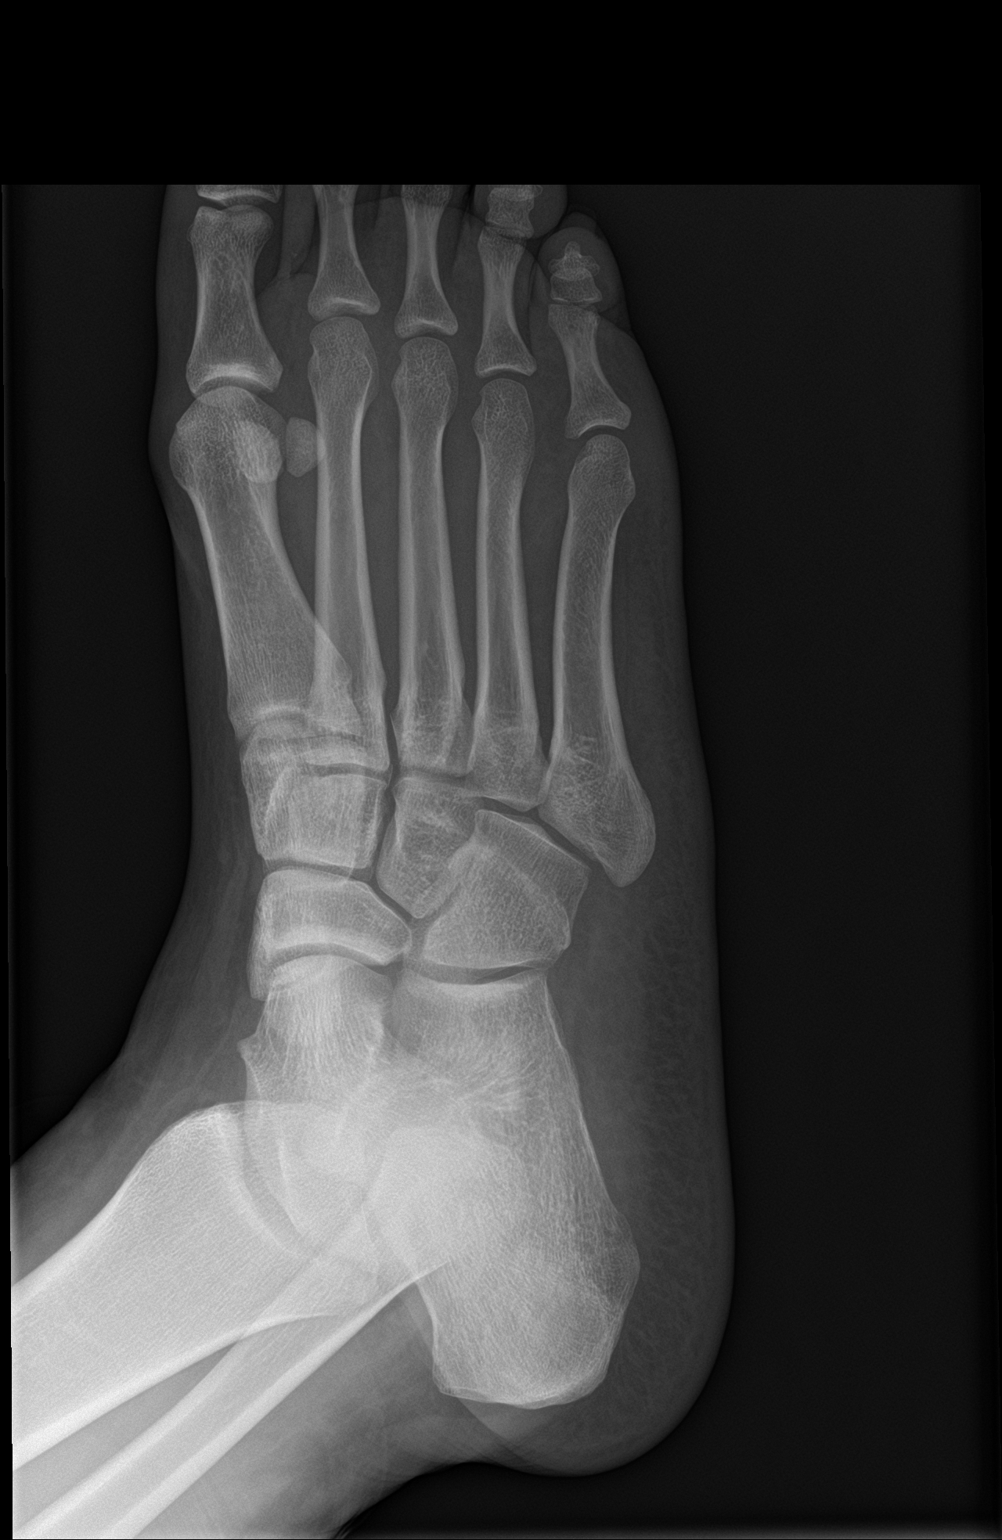

[foot lat]
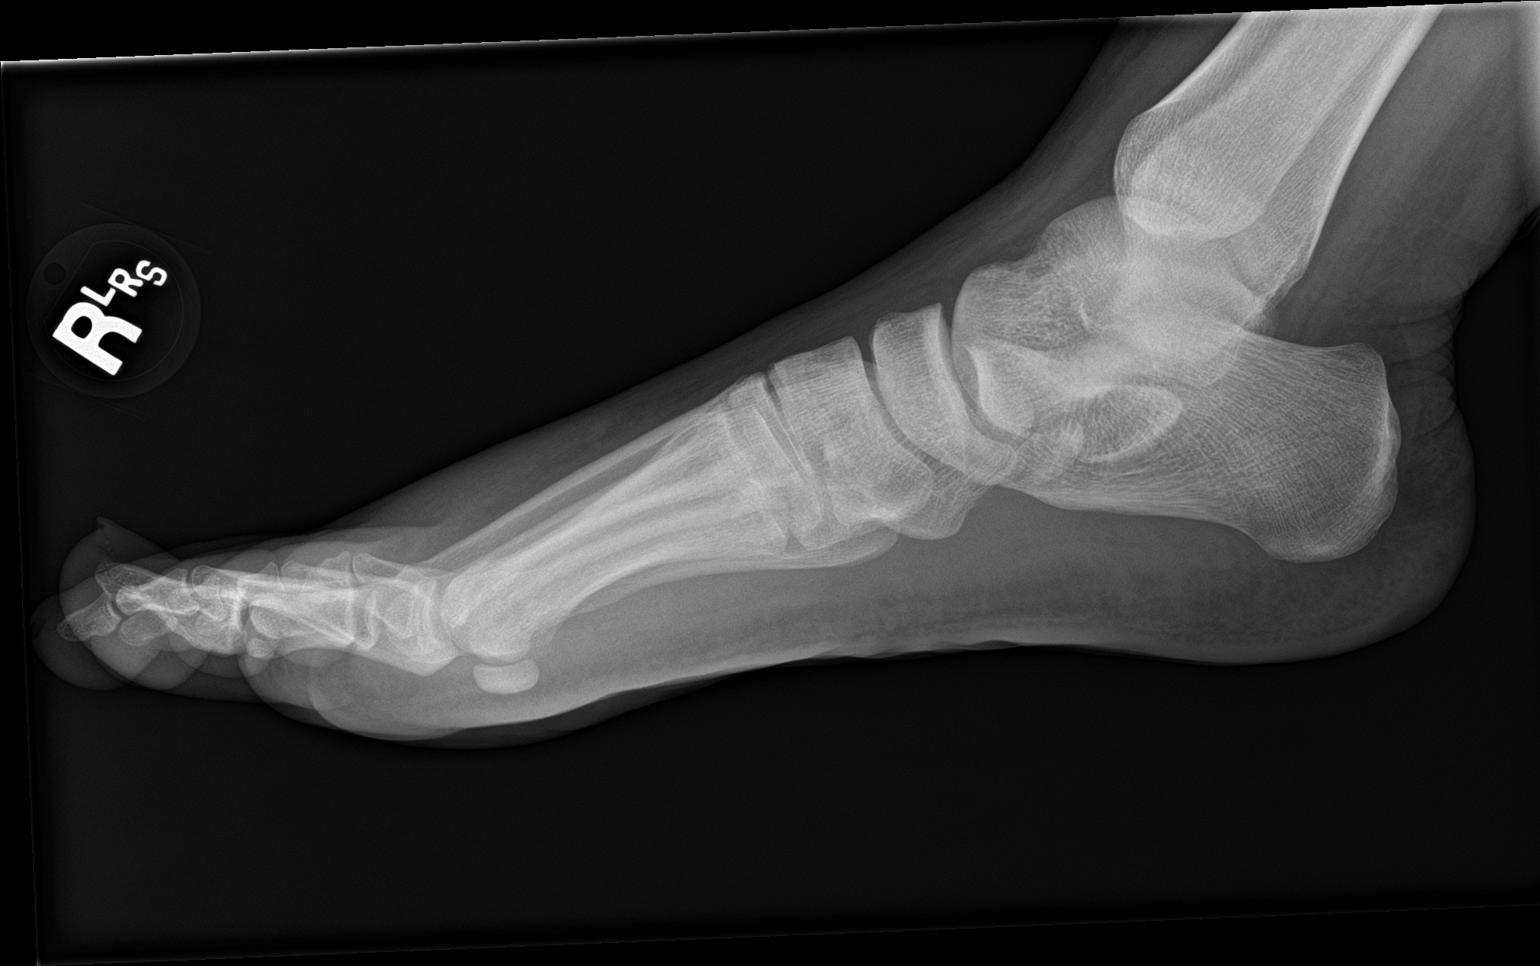

[3 of 3 positions shown; findings below may reference images not displayed]

FINDINGS: There is no evidence of fracture or dislocation. There is no
evidence of arthropathy or other focal bone abnormality. Soft
tissues are unremarkable.
IMPRESSION: Negative.

## 2018-03-09 ENCOUNTER — Other Ambulatory Visit (HOSPITAL_COMMUNITY)
Admission: RE | Admit: 2018-03-09 | Discharge: 2018-03-09 | Disposition: A | Discharge status: Home / Self Care | Payer: Managed Care, Other (non HMO) | Source: Ambulatory Visit | Attending: Obstetrics and Gynecology | Admitting: Obstetrics and Gynecology

## 2018-03-09 ENCOUNTER — Other Ambulatory Visit: Payer: Self-pay | Admitting: Obstetrics and Gynecology

## 2018-03-09 DIAGNOSIS — Z124 Encounter for screening for malignant neoplasm of cervix: Secondary | ICD-10-CM | POA: Diagnosis not present

## 2018-03-10 LAB — CYTOLOGY - PAP
CHLAMYDIA, DNA PROBE: NEGATIVE
DIAGNOSIS: NEGATIVE
HPV: NOT DETECTED
Neisseria Gonorrhea: NEGATIVE

## 2019-07-06 ENCOUNTER — Emergency Department: Admit: 2019-07-07 | Payer: Auto Insurance (includes no fault)

## 2019-07-06 DIAGNOSIS — S0990XA Unspecified injury of head, initial encounter: Secondary | ICD-10-CM

## 2019-07-06 NOTE — ED Notes (Signed)
Discharge summary and prescriptions reviewed with patient. Verbalized understanding. All questions welcomed and answered. Ambulatory off unit with steady gait.
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
## 2019-07-07 ENCOUNTER — Inpatient Hospital Stay
Admit: 2019-07-07 | Discharge: 2019-07-07 | Disposition: A | Discharge status: Home / Self Care | Payer: Auto Insurance (includes no fault) | Attending: Emergency Medicine

## 2019-07-07 MED ORDER — BUTALBITAL-ACETAMINOPHEN-CAFFEINE 50 MG-300 MG-40 MG CAPSULE
50-300-40 mg | ORAL_CAPSULE | ORAL | 0 refills | Status: AC | PRN
Start: 2019-07-07 — End: ?

## 2019-07-07 MED ORDER — ONDANSETRON 4 MG TAB, RAPID DISSOLVE
4 mg | ORAL | Status: AC
Start: 2019-07-07 — End: 2019-07-06
  Administered 2019-07-07: 02:00:00 via ORAL

## 2019-07-07 MED ORDER — ACETAMINOPHEN 500 MG TAB
500 mg | Freq: Once | ORAL | Status: AC
Start: 2019-07-07 — End: 2019-07-06
  Administered 2019-07-07: 02:00:00 via ORAL

## 2019-07-07 MED ORDER — DIAZEPAM 5 MG TAB
5 mg | ORAL | Status: AC
Start: 2019-07-07 — End: 2019-07-06
  Administered 2019-07-07: 02:00:00 via ORAL

## 2019-07-07 MED ORDER — IBUPROFEN 600 MG TAB
600 mg | ORAL_TABLET | Freq: Four times a day (QID) | ORAL | 0 refills | Status: AC | PRN
Start: 2019-07-07 — End: 2019-07-21

## 2019-07-07 MED ORDER — TIZANIDINE 4 MG TAB
4 mg | ORAL_TABLET | Freq: Three times a day (TID) | ORAL | 0 refills | Status: AC | PRN
Start: 2019-07-07 — End: 2019-07-11

## 2019-07-07 MED FILL — ACETAMINOPHEN 500 MG TAB: 500 mg | ORAL | Qty: 2

## 2019-07-07 MED FILL — DIAZEPAM 5 MG TAB: 5 mg | ORAL | Qty: 1

## 2019-07-07 MED FILL — ONDANSETRON 4 MG TAB, RAPID DISSOLVE: 4 mg | ORAL | Qty: 1

## 2019-09-15 ENCOUNTER — Encounter

## 2022-06-18 ENCOUNTER — Ambulatory Visit: Admit: 2022-06-18 | Payer: PRIVATE HEALTH INSURANCE | Attending: Internal Medicine | Primary: Internal Medicine

## 2022-06-18 ENCOUNTER — Encounter

## 2022-06-18 DIAGNOSIS — Z0000 Encounter for general adult medical examination without abnormal findings: Secondary | ICD-10-CM

## 2022-06-18 NOTE — Progress Notes (Signed)
Subjective    Brianna Carter is a 31 y.o. female who presents today for the following: patient was seen to establish care.     Has not seen a PCP in a few years. Reports that she just got back for [place] in the last few days and is now with diarrhea. Has been taking OTC to help. Not able to eat but so much. No fever.     Mom with history of thyroid CA. Asking for labs to be drawn. Has been negative in the past.     Has had allergy testing and is allergic to dogs, cats and several trees and dust.     Saw her GYN in the past month. PAP was done and with some abnormal cells. Lab work was also done.   Chief Complaint   Patient presents with    New Patient     Establishing care    Nausea    Diarrhea     Travelers diarrhea possible           PMH/PSH/Allergies/Social History/medication list and most recent studies reviewed with patient.     reports that she has never smoked. She has never used smokeless tobacco.    reports current alcohol use.     Vitals:    06/18/22 0906   BP: 112/72   Pulse: 66   Resp: 18   Temp: 97.5 F (36.4 C)   SpO2: 98%      No past medical history on file.    No Known Allergies     Current Outpatient Medications on File Prior to Visit   Medication Sig Dispense Refill    Fexofenadine-Pseudoephedrine (ALLEGRA-D 24 HOUR PO) Take by mouth       No current facility-administered medications on file prior to visit.           Review of Systems   Constitutional:  Negative for fatigue and fever.   Respiratory: Negative.     Cardiovascular: Negative.    Gastrointestinal:  Positive for diarrhea. Negative for abdominal pain, rectal pain and vomiting.   Genitourinary: Negative.    Musculoskeletal: Negative.    Neurological: Negative.    Psychiatric/Behavioral: Negative.         Objective    Physical Exam  Vitals and nursing note reviewed.   Constitutional:       General: She is not in acute distress.     Appearance: She is obese.   Neck:      Vascular: No carotid bruit.   Cardiovascular:      Rate and  Rhythm: Normal rate and regular rhythm.   Pulmonary:      Effort: Pulmonary effort is normal.      Breath sounds: Normal breath sounds.   Abdominal:      General: Bowel sounds are normal. There is no distension.      Palpations: Abdomen is soft.      Tenderness: There is no abdominal tenderness.   Musculoskeletal:         General: Normal range of motion.      Cervical back: Neck supple. No rigidity.   Lymphadenopathy:      Cervical: No cervical adenopathy.   Skin:     General: Skin is warm.   Neurological:      Mental Status: She is alert and oriented to person, place, and time.   Psychiatric:         Behavior: Behavior normal.  Assessment & Plan    Brianna Carter was seen today for new patient, nausea and diarrhea.    Diagnoses and all orders for this visit:    Encounter for medical examination to establish care    Allergy, initial encounter    Family history of thyroid cancer  -     Thyroid Cascade Profile; Future      Reviewed how to treat travelers diarrhea. Increase fluids and bland like diet. Begin to take imodium as needed.     Reviewed with patient medication side effects in detail   I answered all patient questions and concerns  Labs and testing done and/or upcoming labs/test were reviewed and discussed   Reviewed and discussed daily activity, exercise and diet        Return in about 1 year (around 06/19/2023) for follow up if symptoms persist, follow up for routine visit or before if needed.     Maree Erie, APRN - NP

## 2022-06-18 NOTE — Progress Notes (Signed)
Rm    Chief Complaint   Patient presents with    New Patient     Establishing care    Nausea    Diarrhea     Travelers diarrhea possible        BP 112/72 (Site: Left Upper Arm, Position: Sitting, Cuff Size: Large Adult)   Pulse 66   Temp 97.5 F (36.4 C) (Oral)   Resp 18   Ht 5\' 4"  (1.626 m)   Wt 260 lb 6.4 oz (118.1 kg)   LMP 05/25/2022   SpO2 98%   BMI 44.70 kg/m      1. Have you been to the ER, urgent care clinic since your last visit?  Hospitalized since your last visit? no    2. Have you seen or consulted any other health care providers outside of the [facility] since your last visit?  Include any pap smears or colon screening.  no    Health Maintenance Due   Topic Date Due    COVID-19 Vaccine (1) Never done    Varicella vaccine (1 of 2 - 2-dose childhood series) Never done    Depression Screen  Never done    HIV screen  Never done    Hepatitis C screen  Never done    DTaP/Tdap/Td vaccine (1 - Tdap) Never done    Pap smear  Never done    Flu vaccine (1) Never done        No flowsheet data found.     No flowsheet data found.    No flowsheet data found.

## 2022-12-05 LAB — THYROID CASCADE PROFILE: TSH: 2.84 u[IU]/mL (ref 0.450–4.500)

## 2022-12-05 NOTE — Telephone Encounter (Addendum)
Called 1:23 PM  Spoke with patient after verifying name and DOB. Notified patient of lab results and recommendation from provider. Patient verbalized understanding and given a chance to ask questions.      ----- Message from [facility], APRN - NP sent at 12/05/2022  8:06 AM EST -----  Thyroid stable

## 2023-02-21 ENCOUNTER — Ambulatory Visit: Admit: 2023-02-21 | Discharge: 2023-02-21 | Payer: PRIVATE HEALTH INSURANCE | Primary: Internal Medicine

## 2023-02-21 DIAGNOSIS — J069 Acute upper respiratory infection, unspecified: Secondary | ICD-10-CM

## 2023-02-21 LAB — POCT COVID-19 RAPID, NAAT: SARS-COV-2, RdRp gene: NEGATIVE

## 2023-02-21 LAB — AMB POC RAPID STREP A: Group A Strep Antigen, POC: NEGATIVE

## 2023-02-21 MED ORDER — PROMETHAZINE-DM 6.25-15 MG/5ML PO SYRP
Freq: Four times a day (QID) | ORAL | 0 refills | Status: AC | PRN
Start: 2023-02-21 — End: 2023-02-28

## 2023-02-21 MED ORDER — PROMETHAZINE-DM 6.25-15 MG/5ML PO SYRP
Freq: Four times a day (QID) | ORAL | 0 refills | Status: DC | PRN
Start: 2023-02-21 — End: 2023-02-21

## 2023-02-21 NOTE — Addendum Note (Signed)
Addended byAntionette Char on: 02/21/2023 06:38 PM     Modules accepted: Orders

## 2023-02-21 NOTE — Progress Notes (Addendum)
Subjective     Chief Complaint   Patient presents with    Pharyngitis     Pt. feels as if thoats closing, cough & itching of the throat; sore throat x1; was sick last wk and felt better; took allergy med and no relief         Patient is 32 year old female presenting with productive cough and throat itching and soreness.  Symptoms began one week ago.  She has history of allergies and has been taking allergy medicine for symptoms.  She denies fever or chills.  She denies appetite changes.  She reports cough keeps her up at night.          History reviewed. No pertinent past medical history.    History reviewed. No pertinent surgical history.    Family History   Problem Relation Age of Onset    Thyroid Cancer Mother     Asthma Mother     Asthma Sister     Hypertension Sister     Arthritis Brother     Breast Cancer Maternal Grandmother     Diabetes Maternal Grandmother        No Known Allergies    Social History     Tobacco Use    Smoking status: Never    Smokeless tobacco: Never   Vaping Use    Vaping Use: Never used   Substance Use Topics    Alcohol use: Yes     Comment: socially    Drug use: Never       Vitals:    02/21/23 1613   BP: 114/79   Pulse: 73   Resp: 18   Temp: 97.7 F (36.5 C)   SpO2: 99%       Review of Systems   Constitutional:  Negative for chills and fever.   HENT:  Positive for sore throat.    Respiratory:  Positive for cough. Negative for shortness of breath.        Objective     Physical Exam  Constitutional:       General: She is not in acute distress.     Appearance: Normal appearance. She is not ill-appearing.   HENT:      Head: Normocephalic and atraumatic.      Mouth/Throat:      Mouth: Mucous membranes are moist.      Pharynx: Posterior oropharyngeal erythema present.   Cardiovascular:      Rate and Rhythm: Normal rate and regular rhythm.      Pulses: Normal pulses.      Heart sounds: Normal heart sounds.   Pulmonary:      Effort: Pulmonary effort is normal.      Breath sounds: Normal breath  sounds.   Lymphadenopathy:      Cervical: Cervical adenopathy present.   Skin:     General: Skin is warm and dry.   Neurological:      Mental Status: She is alert and oriented to person, place, and time.         Assessment & Plan     Diagnoses and all orders for this visit:  Viral upper respiratory tract infection  -     AMB POC RAPID STREP A  -     POCT COVID-19 Rapid, NAAT  Other orders  -     promethazine-dextromethorphan (PROMETHAZINE-DM) 6.25-15 MG/5ML syrup; Take 5 mLs by mouth 4 times daily as needed for Cough      No orders to display  Vital signs stable  Pt in no acute distress and afebrile  Pt alert and competent    Patient is previously healthy and immunocompetent, presenting with symptoms suspicious for likely viral upper respiratory infection.  Differential includes acute bronchitis, rhinosinusitis, allergic rhinitis, bacterial pneumonia, or COVID.  Do not suspect underlying cardiopulmonary process.  I considered, but think unlikely, dangerous causes of this patient's symptoms to include ACS, CHF or COPD exacerbations, pneumonia, pneumothorax.  Patient is nontoxic appearing and not in need of emergent medical intervention.    The patient is a good candidate for outpatient therapy based on normal PO intake, reassuring exam with clear lungs on auscultation, normal oxygen saturations and lack of respiratory distress upon discharge.    Discharge decision based on the following:  clinical impression is consistent with outpatient treatment, patient's exam is stable and patient's condition is stable.    -Provided reassurance and reassessment  -Discussed over-the-counter medications for symptomatic relief  -Provided educational material and patient instructions  -Discharged patient with instructions to follow up with PCP  -Advised to go immediately to the ED for worsening or persistent symptoms      I have discussed the results, diagnosis and treatment plan with the patient.  The patient also understands that  early in the process of an illness, an urgent care workup can be falsely reassuring.  Routine discharge counseling and specific return precautions discussed with patient and the patient understands that worsening, changing or persistent symptoms should prompt an immediate return to the urgent care or emergency department.  Patient/Guardian expressed understanding and agrees with the discharge plan.  No further questions at time of discharge.    Antionette Char, APRN - CNP

## 2023-02-21 NOTE — Patient Instructions (Signed)
Thank you for visiting [facility] today    Nasal Congestion:  Flonase or Nasonex (over the counter) nasal spray, once a day  Saline irrigation kits help wash out sinuses 1-2 times a day  Normal saline nasal spray  Afrin nasal spray no longer than three days  Cough:  Throat lozenges, hot tea, and honey may help  Vicks VapoRub at night to help with cough and relieve muscles aches and pain  If not prescribed a cough medication, Robitussin DM is an option.  It is an over the counter cough medication containing dextromethorphan to help suppress cough at night   *Please only take when absolutely needed, as this is a controlled substance that can cause addiction   *Please only take cough syrup at nighttime as it causes drowsiness   *Do not drive or operate any machinery while taking this medication  If you have high blood pressure, take Coricidin HBP (or the generic form) instead.  Follow instructions on the box.  Congestion:  For thick mucus, take Mucinex (with Guafenesin only) to help thin the mucus.  Follow instructions on the box.  You will need to drink plenty of water with this medication.  Sore Throat:  Lozenges, as needed. Cepacol lozenges will help numb the throat  Chloraseptic spray also helps to numb throat pain  Salt water gargles to soothe throat pain  Sinus pain/pressure:  Warm, wet towel on face to help with facial sinus pain/pressure  Headache/Pain Fever/Body Aches:  If you can take NSAIDs, take Ibuprofen 400-800mg every 8 hours as needed  If you cannot take NSAIDs, take Tylenol 325-500mg every 6 hours as needed  Miscellanous:  Zyrtec/Xyzal/Allegra/Claritin during the day or Benadryl at night may help with allergies.  You  may also use the decongestant version of these medications.   Simple foods like chicken noodle soup, smoothies, hot tea with lemon and honey may also help  Avoid smoking  Minimize exposure to irritants    Please follow up with your primary care provider within 2-5 days if  your signs and symptoms have not resolved or worsened.    Please go immediately to the Emergency Department if you develop shortness of breath, chest pain and uncontrollable fever above 100.4F.
